# Patient Record
Sex: Female | Born: 1985 | Race: White | Hispanic: No | Marital: Single | Smoking: Current every day smoker
Health system: Southern US, Community
[De-identification: ages and names within clinical notes are randomized; demographics above are authoritative.]

## PROBLEM LIST (undated history)

## (undated) DIAGNOSIS — B977 Papillomavirus as the cause of diseases classified elsewhere: Secondary | ICD-10-CM

## (undated) DIAGNOSIS — IMO0002 Reserved for concepts with insufficient information to code with codable children: Secondary | ICD-10-CM

## (undated) HISTORY — PX: DILATION AND CURETTAGE OF UTERUS: SHX78

## (undated) HISTORY — PX: INTRAUTERINE DEVICE INSERTION: SHX323

## (undated) HISTORY — PX: TYMPANOSTOMY TUBE PLACEMENT: SHX32

---
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
## 2004-04-12 ENCOUNTER — Other Ambulatory Visit: Admission: RE | Admit: 2004-04-12 | Discharge: 2004-04-12 | Payer: Self-pay | Admitting: Family Medicine

## 2004-07-09 ENCOUNTER — Other Ambulatory Visit: Admission: RE | Admit: 2004-07-09 | Discharge: 2004-07-09 | Payer: Self-pay | Admitting: Obstetrics and Gynecology

## 2004-12-28 ENCOUNTER — Emergency Department (HOSPITAL_COMMUNITY): Admission: EM | Admit: 2004-12-28 | Discharge: 2004-12-28 | Payer: Self-pay | Admitting: Emergency Medicine

## 2005-02-15 ENCOUNTER — Emergency Department (HOSPITAL_COMMUNITY): Admission: EM | Admit: 2005-02-15 | Discharge: 2005-02-15 | Payer: Self-pay | Admitting: Family Medicine

## 2005-12-23 ENCOUNTER — Inpatient Hospital Stay (HOSPITAL_COMMUNITY): Admission: AD | Admit: 2005-12-23 | Discharge: 2005-12-23 | Payer: Self-pay | Admitting: Family Medicine

## 2006-01-13 ENCOUNTER — Ambulatory Visit (HOSPITAL_COMMUNITY): Admission: RE | Admit: 2006-01-13 | Discharge: 2006-01-13 | Payer: Self-pay | Admitting: Obstetrics and Gynecology

## 2006-05-06 ENCOUNTER — Inpatient Hospital Stay (HOSPITAL_COMMUNITY): Admission: AD | Admit: 2006-05-06 | Discharge: 2006-05-06 | Payer: Self-pay | Admitting: Obstetrics

## 2006-06-14 ENCOUNTER — Inpatient Hospital Stay (HOSPITAL_COMMUNITY): Admission: AD | Admit: 2006-06-14 | Discharge: 2006-06-17 | Payer: Self-pay | Admitting: Obstetrics & Gynecology

## 2006-06-24 ENCOUNTER — Inpatient Hospital Stay (HOSPITAL_COMMUNITY): Admission: RE | Admit: 2006-06-24 | Discharge: 2006-06-29 | Payer: Self-pay | Admitting: Obstetrics & Gynecology

## 2006-06-30 ENCOUNTER — Encounter: Admission: RE | Admit: 2006-06-30 | Discharge: 2006-07-30 | Payer: Self-pay | Admitting: Obstetrics & Gynecology

## 2006-07-31 ENCOUNTER — Encounter: Admission: RE | Admit: 2006-07-31 | Discharge: 2006-08-21 | Payer: Self-pay | Admitting: Obstetrics & Gynecology

## 2006-08-11 ENCOUNTER — Emergency Department (HOSPITAL_COMMUNITY): Admission: EM | Admit: 2006-08-11 | Discharge: 2006-08-11 | Payer: Self-pay | Admitting: Emergency Medicine

## 2008-05-18 IMAGING — US US OB COMP +14 WK
1 series · 13 of 28 positions shown · non-contrast
Comparison: none

CLINICAL DATA: 18 week 0 day gestational age by LMP.

[Series 1: us ob comp +14 wk · 0.25mm/px · 13 of 64 slices shown]
[im 3/64]
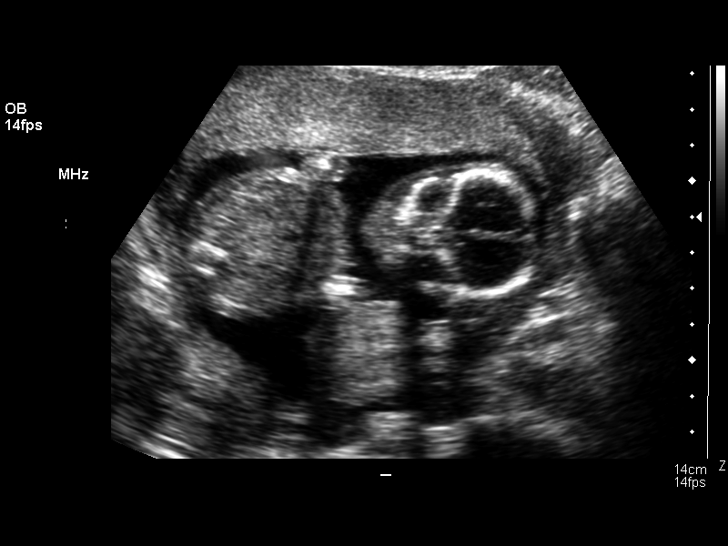
[im 8/64]
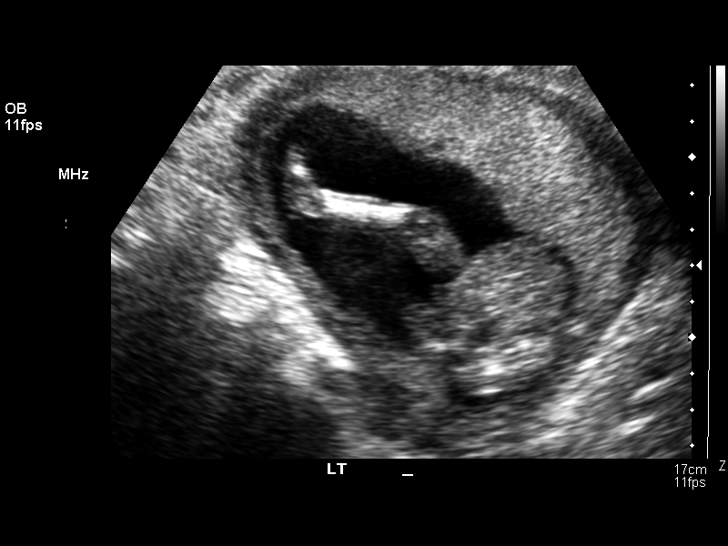
[im 12/64]
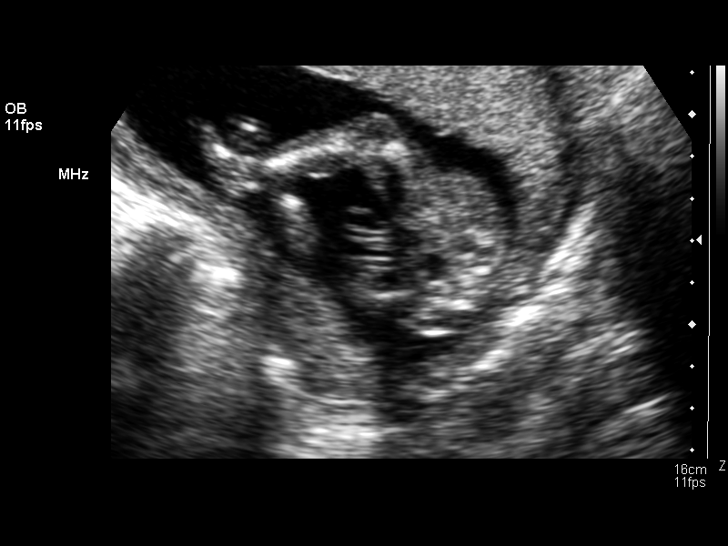
[im 17/64]
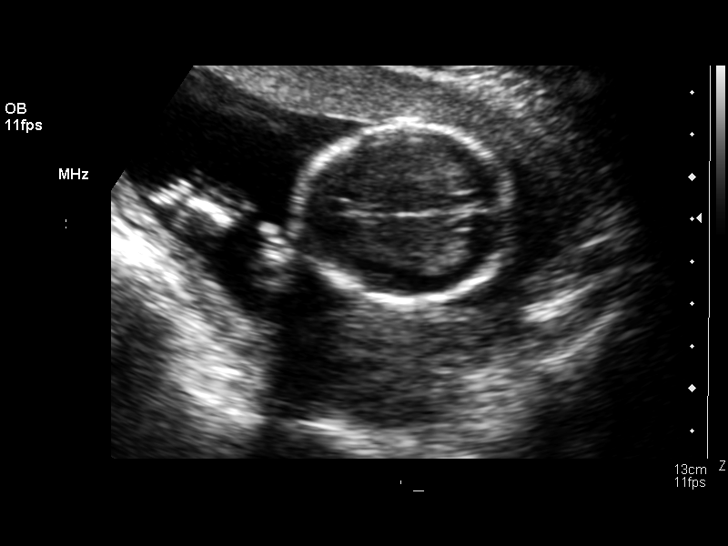
[im 22/64]
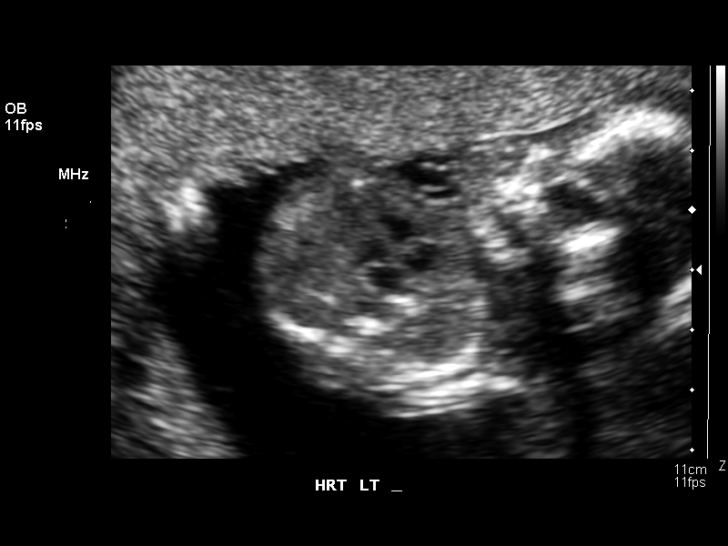
[im 26/64]
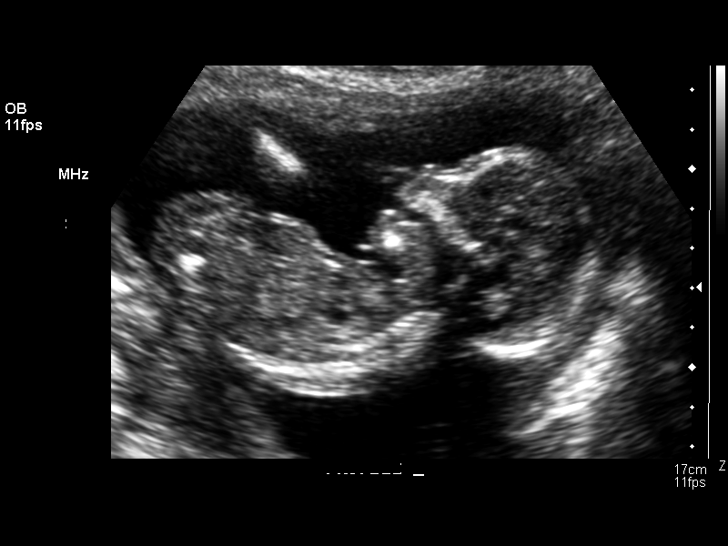
[im 33/64]
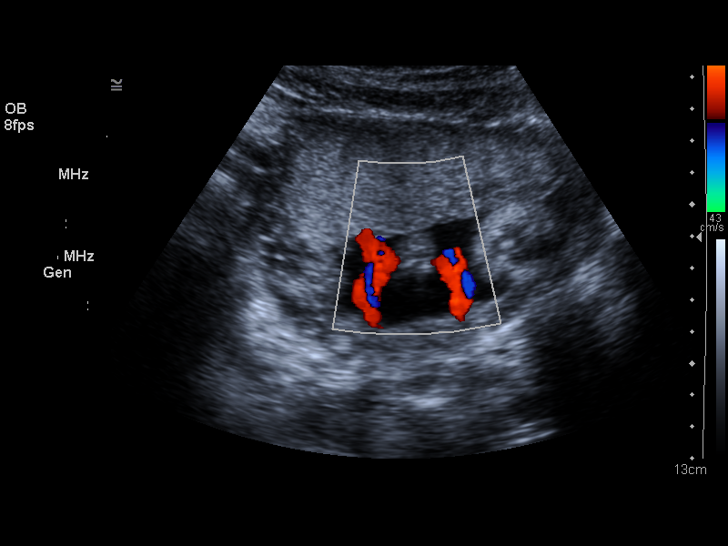
[im 38/64]
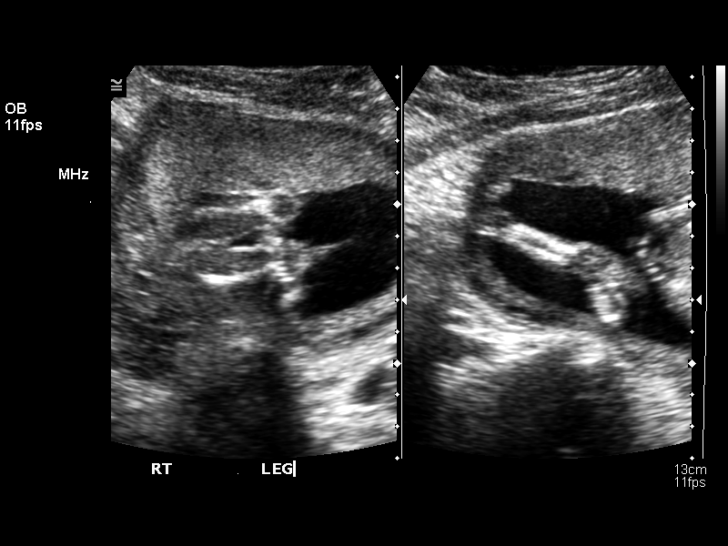
[im 43/64]
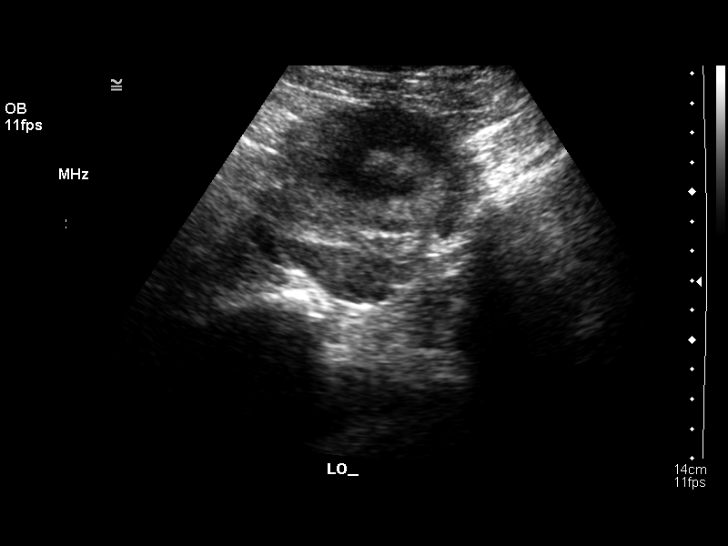
[im 47/64]
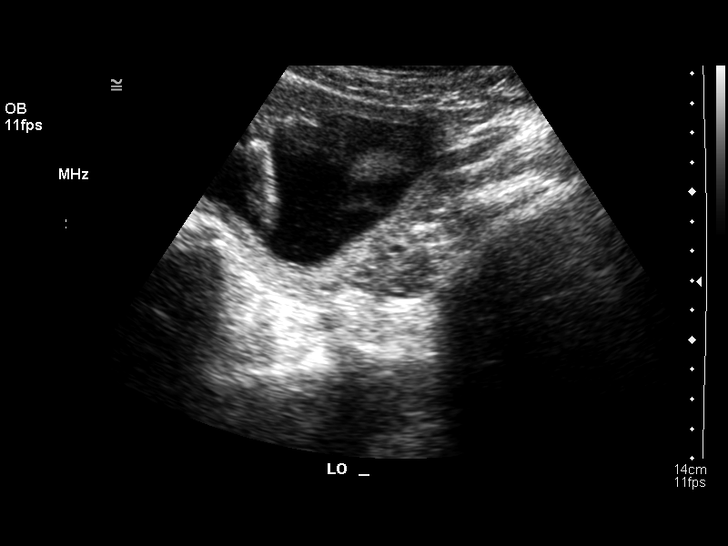
[im 52/64]
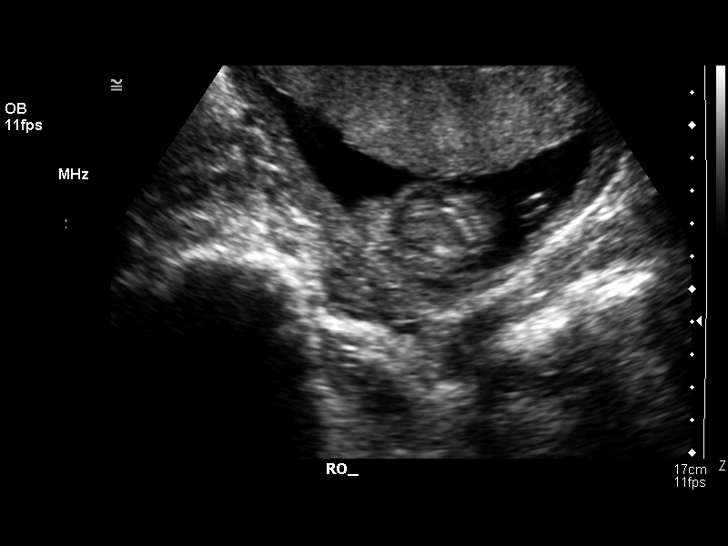
[im 57/64]
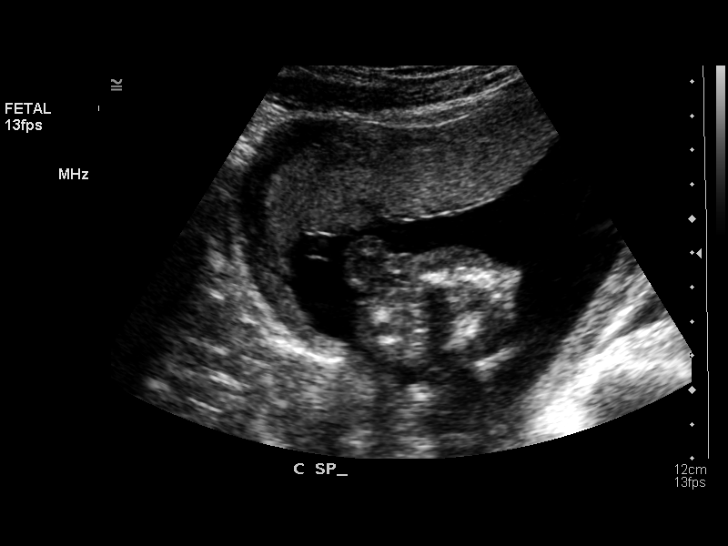
[im 61/64]
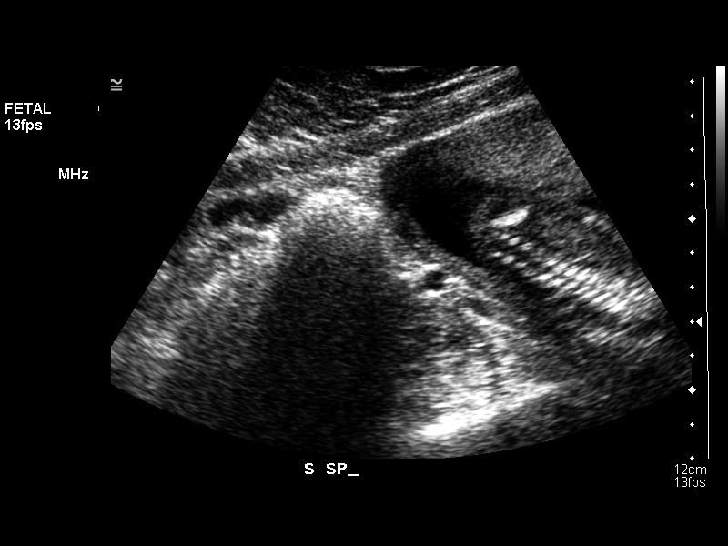

[13 of 28 positions shown; findings below may reference images not displayed]

OBSTETRICAL ULTRASOUND:
 Number of Fetuses:  1
 Heart Rate:  150 bpm
 Movement:  Yes
 Breathing:  No
 Presentation:  Transverse with head to maternal left
 Placental Location:  Anterior
 Grade:  I
 Previa:   No
 Amniotic Fluid (Subjective):  Normal
 Amniotic Fluid (Objective): 4.3 cm vertical pocket 

 FETAL BIOMETRY
 BPD:  4.0 cm   18 w 2 d 
 HC:  15.2 cm   18 w 2 d 
 AC:  13.1 cm   18 w 4 d 
 FL:  2.7 cm   18 w 1 d 

 MEAN GA:  18 w 2 d  US EDC:  06/14/06

 FETAL ANATOMY
 Lateral Ventricles:  Visualized 
 Thalami/CSP:  Visualized 
 Posterior Fossa:  Visualized 
 Nuchal Region:  NF= 2.9 mm  Visualized 
 Spine:  Visualized 
 4 Chamber Heart on Left:  Visualized 
 Stomach on Left:  Visualized 
 3 Vessel Cord:  Visualized 
 Cord Insertion site:  Visualized 
 Kidneys:  Visualized 
 Bladder:  Visualized 
 Extremities:  Visualized 

 ADDITIONAL ANATOMY VISUALIZED:  LVOT, orbits, profile, diaphragm, and heel.  

 Evaluation limited by:  Fetal position.  

 MATERNAL UTERINE AND ADNEXAL FINDINGS
 Cervix:  3.4 cm transabdominal.

 Both ovaries are unremarkable.
IMPRESSION: 1.  Single living intrauterine fetus with mean gestational age of 18 weeks 2 days and US EDC of 06/14/06.  This is concordant with LMP.  
 2.  No evidence of fetal anatomic abnormality.

## 2009-04-08 ENCOUNTER — Emergency Department (HOSPITAL_COMMUNITY): Admission: EM | Admit: 2009-04-08 | Discharge: 2009-04-08 | Payer: Self-pay | Admitting: Family Medicine

## 2010-07-09 NOTE — Discharge Summary (Signed)
NAMEMASIAH, LEWING                  ACCOUNT NO.:  [id number]   MEDICAL RECORD NO.:  [id number]          PATIENT TYPE:  INP   LOCATION:  9169                          FACILITY:  [facility]   PHYSICIAN:  Roseanna Rainbow, M.D.DATE OF BIRTH:  1985-03-14   DATE OF ADMISSION:  06/14/2006  DATE OF DISCHARGE:  06/17/2006                               DISCHARGE SUMMARY   HISTORY OF PRESENT ILLNESS:  The patient is a 25 year old, G1, P0 with  an estimated date of confinement of April 22, with an intrauterine  pregnancy at 39+ weeks for induction of labor secondary to possible  PUPPP.  Please see the dictated history and physical for further  details.   HOSPITAL COURSE:  The patient was admitted.  She was started on Pitocin  and there was no change in her cervix.  The Pitocin was discontinued and  attempts were made to ripen the cervix with Cervidil and subsequent to  the Cervidil being removed, the cervix remained unfavorable and decision  was made to abort any further attempts to induce labor.  She was  discharged to home with plans to repeat an attempted induction at a  later date.   DISCHARGE DIAGNOSIS:  Intrauterine pregnancy at term, failed induction,  possible pruritic urticarial papules and plaque of pregnancy (PUPPP).   CONDITION ON DISCHARGE:  Stable.   DIET:  Regular.   ACTIVITY:  Ad lib.   DISCHARGE MEDICATIONS:  Resume previous medications.   DISPOSITION:  The patient was to follow up in the office in several  days.      Roseanna Rainbow, M.D.  Electronically Signed     LAJ/MEDQ  D:  07/31/2006  T:  07/31/2006  Job:  [id number]

## 2010-07-12 NOTE — H&P (Signed)
Katrina Oconnell, Katrina Oconnell               ACCOUNT NO.:  [id number]   MEDICAL RECORD NO.:  [id number]          PATIENT TYPE:  INP   LOCATION:  9169                          FACILITY:  [facility]   PHYSICIAN:  Roseanna Rainbow, M.D.DATE OF BIRTH:  December 18, 1985   DATE OF ADMISSION:  06/14/2006  DATE OF DISCHARGE:                              HISTORY & PHYSICAL   CHIEF COMPLAINT:  The patient is a 26 year old gravida 1, para 0 with an  estimated date of confinement of June 16, 2006 with an intrauterine  pregnancy at 39+ weeks for induction of labor secondary to possible  PUPPP.   HISTORY OF PRESENT ILLNESS:  Please see the above.  The patient gives a  several-week history of diffuse pruritus.  Bile acids and liver  transaminase testing has been normal.  She has recently completed a Solu-  Medrol dose pack.  She has also been using other supportive measures.   ALLERGIES:  NO KNOWN DRUG ALLERGIES.   MEDICATIONS:  Please see the reconciliation form.   OB RISK FACTORS:  Bipolar disorder.   LABORATORY DATA:  One-hour GCT 100.  Chlamydia in April negative.  Hepatitis B surface antigen negative.  Hematocrit 35.1, hemoglobin 11.8.  Pap smear LSIL.  Platelets 192,000.  Blood type is O positive, antibody  screen negative.  RPR nonreactive.  Rubella nonimmune.  Varicella  immune.   PAST GYN HISTORY:  Please see the above.   PAST SURGICAL HISTORY:  1. Ear surgery.  2. Eye surgery.   SOCIAL HISTORY:  She works at [employer].  She is  separated from her spouse.  Does not drink alcoholic beverages.  She is  a recovering alcoholic.  She has no significant smoking history.  Previous used marijuana and cocaine.  No current substance abuse.   FAMILY HISTORY:  No major illnesses known.   PHYSICAL EXAMINATION:  VITAL SIGNS:  Blood pressure 124/74, weight 216  pounds.  Fetal heart tracing reassuring.  Tocodynamometer with regular  uterine contractions.  Sterile vaginal exam per the  R.N.   ASSESSMENT:  Primigravida at term.  Likely pruritic urticarial papules  and plaques of pregnancy.  Unfavorable Bishop's score.   PLAN:  Admission.  Two-stage induction of labor.  Will start with  cervical ripening with Cervidil.      Roseanna Rainbow, M.D.  Electronically Signed     LAJ/MEDQ  D:  06/15/2006  T:  06/15/2006  Job:  [id number]

## 2010-12-02 ENCOUNTER — Inpatient Hospital Stay (INDEPENDENT_AMBULATORY_CARE_PROVIDER_SITE_OTHER)
Admission: RE | Admit: 2010-12-02 | Discharge: 2010-12-02 | Disposition: A | Discharge status: Home / Self Care | Payer: Self-pay | Source: Ambulatory Visit | Attending: Family Medicine | Admitting: Family Medicine

## 2010-12-02 DIAGNOSIS — H538 Other visual disturbances: Secondary | ICD-10-CM

## 2010-12-02 LAB — GLUCOSE, CAPILLARY

## 2010-12-11 LAB — POCT PREGNANCY, URINE
Operator id: 116391
Preg Test, Ur: NEGATIVE

## 2012-01-25 ENCOUNTER — Inpatient Hospital Stay (HOSPITAL_COMMUNITY): Payer: Commercial Managed Care - PPO

## 2012-01-25 ENCOUNTER — Encounter (HOSPITAL_COMMUNITY): Payer: Self-pay | Admitting: Obstetrics and Gynecology

## 2012-01-25 ENCOUNTER — Inpatient Hospital Stay (HOSPITAL_COMMUNITY)
Admission: AD | Admit: 2012-01-25 | Discharge: 2012-01-25 | Disposition: A | Discharge status: Home / Self Care | Payer: Commercial Managed Care - PPO | Source: Ambulatory Visit | Attending: Obstetrics & Gynecology | Admitting: Obstetrics & Gynecology

## 2012-01-25 DIAGNOSIS — N949 Unspecified condition associated with female genital organs and menstrual cycle: Secondary | ICD-10-CM | POA: Insufficient documentation

## 2012-01-25 DIAGNOSIS — R109 Unspecified abdominal pain: Secondary | ICD-10-CM | POA: Insufficient documentation

## 2012-01-25 DIAGNOSIS — N946 Dysmenorrhea, unspecified: Secondary | ICD-10-CM | POA: Insufficient documentation

## 2012-01-25 HISTORY — DX: Reserved for concepts with insufficient information to code with codable children: IMO0002

## 2012-01-25 HISTORY — DX: Papillomavirus as the cause of diseases classified elsewhere: B97.7

## 2012-01-25 LAB — URINALYSIS, ROUTINE W REFLEX MICROSCOPIC
Glucose, UA: NEGATIVE mg/dL
Ketones, ur: 15 mg/dL — AB
Leukocytes, UA: NEGATIVE
Nitrite: NEGATIVE
Protein, ur: NEGATIVE mg/dL

## 2012-01-25 LAB — URINE MICROSCOPIC-ADD ON

## 2012-01-25 LAB — WET PREP, GENITAL: Clue Cells Wet Prep HPF POC: NONE SEEN

## 2012-01-25 LAB — POCT PREGNANCY, URINE: Preg Test, Ur: NEGATIVE

## 2012-01-25 MED ORDER — IBUPROFEN 800 MG PO TABS: 800.0000 mg | ORAL_TABLET | Freq: Three times a day (TID) | ORAL | Status: AC | PRN

## 2012-01-25 MED ORDER — KETOROLAC TROMETHAMINE 60 MG/2ML IM SOLN
60.0000 mg | Freq: Once | INTRAMUSCULAR | Status: AC
  Administered 2012-01-25: 60 mg via INTRAMUSCULAR
  Filled 2012-01-25: qty 2

## 2012-01-25 NOTE — MAU Note (Signed)
Pt reports having a LLQ pain that started this morning. Pt has been having this pain  When her period starts. Told she has an ovarian cyst when she was younger.

## 2012-01-25 NOTE — MAU Provider Note (Signed)
History     CSN: [id number]  Arrival date and time: 01/25/12 1223   First Provider Initiated Contact with Patient 01/25/12 1339      Chief Complaint  Patient presents with  . Abdominal Pain   HPI 26 y.o. G1P1001 with left sided pelvic pain. States she has had this pain intermittently x 10 years or more around the time of her period. Started this morning again, worse today than usual. Periods are heavy and more painful since getting paragard, which she has had for 5 years.    Past Medical History  Diagnosis Date  . Abnormal Pap smear   . HPV (human papilloma virus) infection     Past Surgical History  Procedure Date  . Tympanostomy tube placement   . Dilation and curettage of uterus   . Intrauterine device insertion     Family History  Problem Relation Age of Onset  . Adopted: Yes  . Family history unknown: Yes    History  Substance Use Topics  . Smoking status: Current Every Day Smoker  . Smokeless tobacco: Not on file  . Alcohol Use: Yes    Allergies: No Known Allergies  No prescriptions prior to admission    Review of Systems  Constitutional: Negative.   Respiratory: Negative.   Cardiovascular: Negative.   Gastrointestinal: Positive for abdominal pain. Negative for nausea, vomiting, diarrhea and constipation.  Genitourinary: Negative for dysuria, urgency, frequency, hematuria and flank pain.       Negative for vaginal bleeding (end of menses), vaginal discharge  Musculoskeletal: Negative.   Neurological: Negative.   Psychiatric/Behavioral: Negative.    Physical Exam   Blood pressure 125/77, pulse 76, temperature 97.9 F (36.6 C), temperature source Oral, resp. rate 18, height 5\' 5"  (1.651 m), weight 218 lb (98.884 kg), last menstrual period 01/19/2012.  Physical Exam  Nursing note and vitals reviewed. Constitutional: She is oriented to person, place, and time. She appears well-developed and well-nourished. No distress.  HENT:  Head: Normocephalic  and atraumatic.  Cardiovascular: Normal rate.   Respiratory: Effort normal.  GI: Soft. Bowel sounds are normal. She exhibits no mass. There is no tenderness. There is no rebound and no guarding.  Genitourinary: There is no rash or lesion on the right labia. There is no rash or lesion on the left labia. Uterus is not tender. Enlarged: Size c/w dates. Cervix exhibits no motion tenderness, no discharge and no friability. Right adnexum displays no mass, no tenderness and no fullness. Left adnexum displays tenderness and fullness. Left adnexum displays no mass. There is bleeding (small) around the vagina. No tenderness around the vagina. No vaginal discharge found.  Musculoskeletal: Normal range of motion.  Neurological: She is alert and oriented to person, place, and time.  Skin: Skin is warm and dry.  Psychiatric: She has a normal mood and affect.    MAU Course  Procedures Results for orders placed during the hospital encounter of 01/25/12 (from the past 24 hour(s))  URINALYSIS, ROUTINE W REFLEX MICROSCOPIC     Status: Abnormal   Collection Time   01/25/12 12:50 PM      Component Value Range   Color, Urine YELLOW  YELLOW   APPearance CLOUDY (*) CLEAR   Specific Gravity, Urine >1.030 (*) 1.005 - 1.030   pH 5.5  5.0 - 8.0   Glucose, UA NEGATIVE  NEGATIVE mg/dL   Hgb urine dipstick TRACE (*) NEGATIVE   Bilirubin Urine NEGATIVE  NEGATIVE   Ketones, ur 15 (*) NEGATIVE mg/dL  Protein, ur NEGATIVE  NEGATIVE mg/dL   Urobilinogen, UA 0.2  0.0 - 1.0 mg/dL   Nitrite NEGATIVE  NEGATIVE   Leukocytes, UA NEGATIVE  NEGATIVE  URINE MICROSCOPIC-ADD ON     Status: Abnormal   Collection Time   01/25/12 12:50 PM      Component Value Range   Squamous Epithelial / LPF FEW (*) RARE   WBC, UA 0-2  <3 WBC/hpf   RBC / HPF 0-2  <3 RBC/hpf  POCT PREGNANCY, URINE     Status: Normal   Collection Time   01/25/12  1:02 PM      Component Value Range   Preg Test, Ur NEGATIVE  NEGATIVE  WET PREP, GENITAL      Status: Abnormal   Collection Time   01/25/12  1:40 PM      Component Value Range   Yeast Wet Prep HPF POC NONE SEEN  NONE SEEN   Trich, Wet Prep NONE SEEN  NONE SEEN   Clue Cells Wet Prep HPF POC NONE SEEN  NONE SEEN   WBC, Wet Prep HPF POC MANY (*) NONE SEEN   US Transvaginal Non-ob  01/25/2012  *RADIOLOGY REPORT*  Clinical Data: 26 year old G1 P1, LMP 01/19/2012, indwelling IUD, presenting with left lower quadrant abdominal pain and left pelvic pain.  TRANSABDOMINAL AND TRANSVAGINAL ULTRASOUND OF PELVIS  Technique:  Both transabdominal and transvaginal ultrasound examinations of the pelvis were performed. Transabdominal technique was performed for global imaging of the pelvis including uterus, ovaries, adnexal regions, and pelvic cul-de-sac.  It was necessary to proceed with endovaginal exam following the transabdominal exam to visualize the endometrium and ovaries, as the bladder was suboptimally distended.  Comparison:  No prior pelvic ultrasound apart from an early OB ultrasound 01/13/2006.  Findings:  Uterus: Normal in size and echotexture without focal myometrial abnormality, measuring approximately 7.9 x 3.8 x 4.9 cm.  Endometrium: Normal in thickness measuring approximately 5 mm. Intrauterine device appropriately positioned within the fundal endometrium.  Very small amount of simple appearing free fluid in the endometrial canal.  Right ovary:  Normal in size measuring approximately 3.0 x 2.2 x 2.5 cm, containing small follicular cysts.  No dominant cyst or solid masses.  Left ovary: Normal in size measuring approximately 3.6 x 1.7 x 1.7 cm, containing small follicular cysts.  No dominant cyst or solid masses.  Other findings: No free fluid in the cul-de-sac.  IMPRESSION:  1.  No abnormalities identified to explain left-sided pelvic pain. 2.  Intrauterine device appropriately positioned within the fundal endometrium.  Very small amount of simple appearing free fluid in the endometrium is of  doubtful clinical significance. 3.  Normal-appearing ovaries.  No adnexal masses or free pelvic fluid.   Original Report Authenticated By: Hulan Saas, M.D.    US Pelvis Complete  01/25/2012  *RADIOLOGY REPORT*  Clinical Data: 26 year old G1 P1, LMP 01/19/2012, indwelling IUD, presenting with left lower quadrant abdominal pain and left pelvic pain.  TRANSABDOMINAL AND TRANSVAGINAL ULTRASOUND OF PELVIS  Technique:  Both transabdominal and transvaginal ultrasound examinations of the pelvis were performed. Transabdominal technique was performed for global imaging of the pelvis including uterus, ovaries, adnexal regions, and pelvic cul-de-sac.  It was necessary to proceed with endovaginal exam following the transabdominal exam to visualize the endometrium and ovaries, as the bladder was suboptimally distended.  Comparison:  No prior pelvic ultrasound apart from an early OB ultrasound 01/13/2006.  Findings:  Uterus: Normal in size and echotexture without focal myometrial abnormality, measuring approximately  7.9 x 3.8 x 4.9 cm.  Endometrium: Normal in thickness measuring approximately 5 mm. Intrauterine device appropriately positioned within the fundal endometrium.  Very small amount of simple appearing free fluid in the endometrial canal.  Right ovary:  Normal in size measuring approximately 3.0 x 2.2 x 2.5 cm, containing small follicular cysts.  No dominant cyst or solid masses.  Left ovary: Normal in size measuring approximately 3.6 x 1.7 x 1.7 cm, containing small follicular cysts.  No dominant cyst or solid masses.  Other findings: No free fluid in the cul-de-sac.  IMPRESSION:  1.  No abnormalities identified to explain left-sided pelvic pain. 2.  Intrauterine device appropriately positioned within the fundal endometrium.  Very small amount of simple appearing free fluid in the endometrium is of doubtful clinical significance. 3.  Normal-appearing ovaries.  No adnexal masses or free pelvic fluid.   Original  Report Authenticated By: Hulan Saas, M.D.     Assessment and Plan   1. Dysmenorrhea       Medication List     As of 01/25/2012  3:31 PM    START taking these medications         ibuprofen 800 MG tablet   Commonly known as: ADVIL,MOTRIN   Take 1 tablet (800 mg total) by mouth every 8 (eight) hours as needed for pain.          Where to get your medications    These are the prescriptions that you need to pick up. We sent them to a specific pharmacy, so you will need to go there to get them.   [id number] [place], [place] - [address]    [address] [place] [address]    Phone: [phone]        ibuprofen 800 MG tablet            Follow-up Information    Follow up with [facility]. (As needed)    Contact information:   [address] [place] [address] [phone]           Jerardo Costabile 01/25/2012, 3:31 PM

## 2012-01-26 NOTE — MAU Provider Note (Signed)
Attestation of Attending Supervision of Advanced Practitioner (CNM/NP): Evaluation and management procedures were performed by the Advanced Practitioner under my supervision and collaboration. I have reviewed the Advanced Practitioner's note and chart, and I agree with the management and plan.  Amdrew Oboyle H. 9:25 PM   

## 2012-01-27 LAB — GC/CHLAMYDIA PROBE AMP
CT Probe RNA: NEGATIVE
GC Probe RNA: NEGATIVE

## 2012-03-12 ENCOUNTER — Ambulatory Visit (HOSPITAL_COMMUNITY)
Admission: RE | Admit: 2012-03-12 | Discharge: 2012-03-12 | Disposition: A | Discharge status: Home / Self Care | Payer: Commercial Managed Care - PPO | Attending: Psychiatry | Admitting: Psychiatry

## 2012-03-12 ENCOUNTER — Encounter (HOSPITAL_COMMUNITY): Payer: Self-pay | Admitting: Licensed Clinical Social Worker

## 2012-03-12 DIAGNOSIS — F1010 Alcohol abuse, uncomplicated: Secondary | ICD-10-CM | POA: Insufficient documentation

## 2012-03-12 DIAGNOSIS — F329 Major depressive disorder, single episode, unspecified: Secondary | ICD-10-CM

## 2012-03-12 DIAGNOSIS — F39 Unspecified mood [affective] disorder: Secondary | ICD-10-CM | POA: Insufficient documentation

## 2012-03-12 DIAGNOSIS — F1210 Cannabis abuse, uncomplicated: Secondary | ICD-10-CM | POA: Insufficient documentation
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
## 2012-03-12 NOTE — Progress Notes (Signed)
[facility] MD Progress Note  03/12/2012 4:33 PM Katrina Oconnell  MRN:  [id number] Subjective:  Depressed, argument with her mother today   Sleep: Fair  Appetite:  Fair  Suicidal Ideation:  Denies Homicidal Ideation:  Denies  Psychiatric Specialty Exam: ROS  There were no vitals taken for this visit.There is no height or weight on file to calculate BMI.  General Appearance: Casual  Eye Contact::  Fair  Speech:  Normal Rate  Volume:  Normal  Mood:  Depressed  Affect:  Congruent  Thought Process:  Coherent  Orientation:  Full (Time, Place, and Person)  Thought Content:  WDL  Suicidal Thoughts:  No  Homicidal Thoughts:  No  Memory:  Immediate;   Fair Recent;   Fair Remote;   Fair  Judgement:  Fair  Insight:  Fair  Psychomotor Activity:  Normal  Concentration:  Fair  Recall:  Fair  Akathisia:  No  Handed:  Right  AIMS (if indicated):     Assets:  Housing Physical Health Resilience Social Support  Sleep:      Current Medications: Current Outpatient Prescriptions  Medication Sig Dispense Refill  . ibuprofen (ADVIL,MOTRIN) 800 MG tablet Take 1 tablet (800 mg total) by mouth every 8 (eight) hours as needed for pain.  30 tablet  0    Plan:  Appointment on Apr 04, 2012 with Liborio Nixon at [facility].   I certify that inpatient services furnished can reasonably be expected to improve the patient's condition.   Nanine Means 03/12/2012, 4:33 PM

## 2012-03-15 NOTE — H&P (Signed)
Behavioral Health Medical Screening Exam  Katrina Oconnell is an 26 y.o. female.  Review of Systems  Constitutional: Negative.   HENT: Negative.   Eyes: Negative.   Respiratory: Negative.   Cardiovascular: Negative.   Gastrointestinal: Negative.   Genitourinary: Negative.   Musculoskeletal: Negative.   Skin: Negative.   Neurological: Negative.   Endo/Heme/Allergies: Negative.   Psychiatric/Behavioral: Positive for depression.    Physical Exam  Constitutional: She is oriented to person, place, and time. She appears well-developed and well-nourished.  HENT:  Head: Normocephalic.  Eyes: Pupils are equal, round, and reactive to light.  Neck: Normal range of motion. Neck supple.  Cardiovascular: Normal rate.   Respiratory: Effort normal.  GI: Soft.  Genitourinary:       deferred  Musculoskeletal: Normal range of motion.  Neurological: She is alert and oriented to person, place, and time.  Skin: Skin is warm and dry.    There were no vitals taken for this visit.  Recommendations:  Based on my evaluation the patient does not appear to have an emergency medical condition.   Patient and her mother got into an argument over her taking money.  Katrina Oconnell was upset and came to [facility] to restart her medications that she had been off for months.  An appointment was made for her and encouraged her to attend.  Katrina Oconnell, PMH-NP 03/15/2012, 12:06 PM

## 2012-03-17 NOTE — H&P (Signed)
Reviewed

## 2012-04-05 ENCOUNTER — Ambulatory Visit (HOSPITAL_COMMUNITY): Payer: Commercial Managed Care - PPO | Admitting: Physician Assistant

## 2012-10-18 ENCOUNTER — Encounter (HOSPITAL_COMMUNITY): Payer: Self-pay | Admitting: Emergency Medicine

## 2012-10-18 ENCOUNTER — Emergency Department (INDEPENDENT_AMBULATORY_CARE_PROVIDER_SITE_OTHER)
Admission: EM | Admit: 2012-10-18 | Discharge: 2012-10-18 | Disposition: A | Discharge status: Home / Self Care | Payer: Commercial Managed Care - PPO | Source: Home / Self Care | Attending: Family Medicine | Admitting: Family Medicine

## 2012-10-18 DIAGNOSIS — J069 Acute upper respiratory infection, unspecified: Secondary | ICD-10-CM

## 2012-10-18 LAB — MRSA PCR SCREENING: MRSA by PCR: NEGATIVE

## 2012-10-18 MED ORDER — MUPIROCIN CALCIUM 2 % NA OINT: TOPICAL_OINTMENT | NASAL | Status: AC

## 2012-10-18 NOTE — ED Provider Notes (Signed)
CSN: [id number]     Arrival date & time 10/18/12  1018 History     First MD Initiated Contact with Patient 10/18/12 1046     Chief Complaint  Patient presents with  . MRSA    (Consider location/radiation/quality/duration/timing/severity/associated sxs/prior Treatment) HPI Comments: 27 year old female smoker. Here concerned about possible MRSA colonization in her upper airways. Patient states that she has a son with cystic fibrosis but recently had a throat swab that grew MRSA. She is concerned as she passed it on to him. Patient reports that she had nasal congestion and cough about a week ago but her symptoms have resolved. Denies nasal  Drainage.denies sore throat. Denies fever or chills. Normal appetite and energy level. Nausea was of breath or chest pain. Patient wants to be "checked" for MRSA.   Past Medical History  Diagnosis Date  . Abnormal Pap smear   . HPV (human papilloma virus) infection    Past Surgical History  Procedure Laterality Date  . Tympanostomy tube placement    . Dilation and curettage of uterus    . Intrauterine device insertion     Family History  Problem Relation Age of Onset  . Adopted: Yes   History  Substance Use Topics  . Smoking status: Current Every Day Smoker  . Smokeless tobacco: Not on file  . Alcohol Use: 2.4 oz/week    3 Cans of beer, 1 Shots of liquor per week   OB History   Grav Para Term Preterm Abortions TAB SAB Ect Mult Living   1 1 1  0 0 0 0 0 0 1     Review of Systems  Constitutional: Negative for fever, chills, diaphoresis, activity change, appetite change and fatigue.  HENT: Negative for nosebleeds, rhinorrhea and postnasal drip.   Eyes: Negative for discharge.  Respiratory: Positive for cough. Negative for chest tightness, shortness of breath and wheezing.   Cardiovascular: Negative for chest pain.  Gastrointestinal: Negative for nausea, vomiting, abdominal pain and diarrhea.  Skin: Negative for rash.  Neurological:  Negative for dizziness and headaches.    Allergies  Review of patient's allergies indicates no known allergies.  Home Medications   Current Outpatient Rx  Name  Route  Sig  Dispense  Refill  . ibuprofen (ADVIL,MOTRIN) 800 MG tablet   Oral   Take 1 tablet (800 mg total) by mouth every 8 (eight) hours as needed for pain.   30 tablet   0   . mupirocin nasal ointment (BACTROBAN) 2 %      Apply in each nostril daily   1 g   0    BP 120/88  Pulse 57  Temp(Src) 97.5 F (36.4 C) (Oral)  Resp 20  SpO2 97%  LMP 09/13/2012 Physical Exam  Nursing note and vitals reviewed. Constitutional: She is oriented to person, place, and time. She appears well-developed and well-nourished. No distress.  HENT:  Head: Normocephalic and atraumatic.  Right Ear: External ear normal.  Left Ear: External ear normal.  Mouth/Throat: Oropharynx is clear and moist. No oropharyngeal exudate.  Eyes: Conjunctivae are normal. Right eye exhibits no discharge. Left eye exhibits no discharge. No scleral icterus.  Neck: Neck supple.  Cardiovascular: Normal heart sounds.   Pulmonary/Chest: Breath sounds normal. No respiratory distress. She has no wheezes. She has no rales. She exhibits no tenderness.  Lymphadenopathy:    She has no cervical adenopathy.  Neurological: She is alert and oriented to person, place, and time.  Skin: No rash noted. She is not  diaphoretic.    ED Course   Procedures (including critical care time)  Labs Reviewed - No data to display No results found. 1. URI (upper respiratory infection)     MDM  Performed a nasal swab. Prescribed mupirocin nasal ointment. Supportive care and red flags that should prompt her return to medical attention discussed with patient and provided in writing  Sharin Grave, MD 10/19/12 1038

## 2012-10-18 NOTE — ED Notes (Signed)
Chart review.

## 2012-10-18 NOTE — ED Notes (Signed)
Call from [employer], asking for authorization to substitute muciprin ointment for muciprin nasal oint; authorized

## 2012-10-18 NOTE — ED Notes (Signed)
C/o wants to check for MRSA since son has had a culture done and was positive for MRSA

## 2012-10-26 ENCOUNTER — Telehealth (HOSPITAL_COMMUNITY): Payer: Self-pay | Admitting: *Deleted

## 2012-10-26 NOTE — ED Notes (Signed)
Pt. notified that her MRSA screen by PCR was negative as directed by Dr. Tressia Danas. Vassie Moselle 10/26/2012

## 2012-10-27 NOTE — ED Notes (Signed)
Chart review.

## 2013-12-26 ENCOUNTER — Encounter (HOSPITAL_COMMUNITY): Payer: Self-pay | Admitting: Emergency Medicine

## 2014-05-30 IMAGING — US US TRANSVAGINAL NON-OB
1 series · 13 of 25 positions shown · non-contrast
Comparison: No prior pelvic ultrasound apart from an early OB
ultrasound 01/13/2006.

CLINICAL DATA: 26-year-old G1 P1, LMP 01/19/2012, indwelling IUD,
presenting with left lower quadrant abdominal pain and left pelvic
pain.

TRANSABDOMINAL AND TRANSVAGINAL ULTRASOUND OF PELVIS
TECHNIQUE: Both transabdominal and transvaginal ultrasound
examinations of the pelvis were performed. Transabdominal technique
was performed for global imaging of the pelvis including uterus,
ovaries, adnexal regions, and pelvic cul-de-sac.
It was necessary to proceed with endovaginal exam following the
transabdominal exam to visualize the endometrium and ovaries, as
the bladder was suboptimally distended..

[Series 1: us pelvis complete · 13 of 42 slices shown]
[im 1/42]
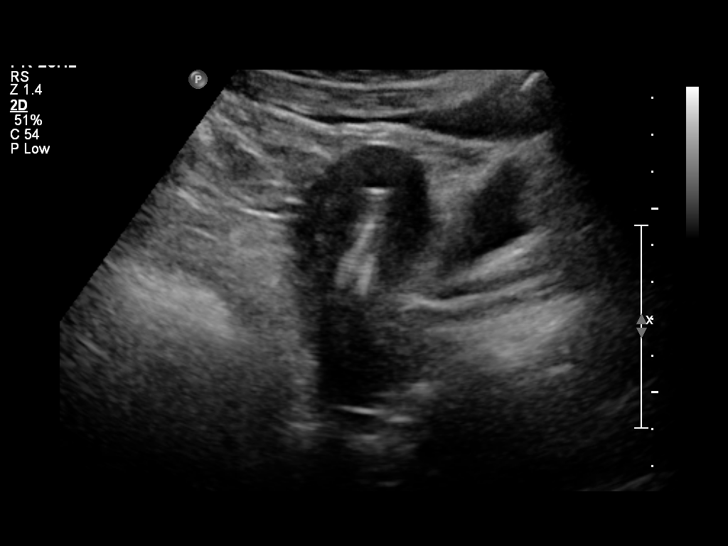
[im 4/42]
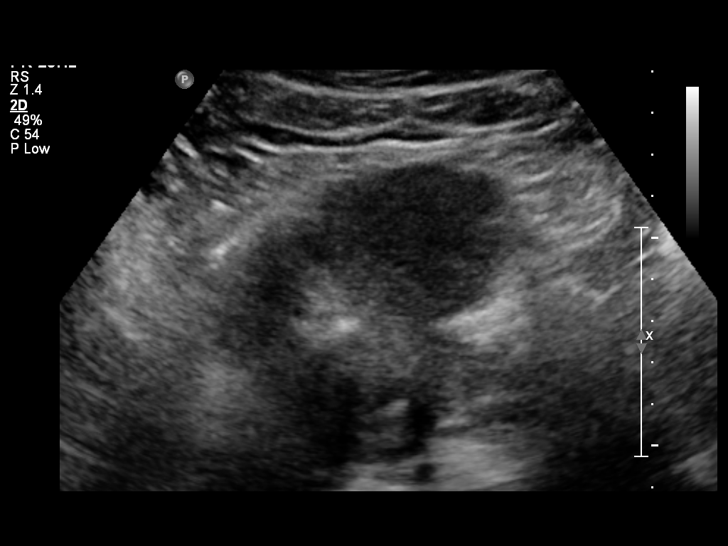
[im 7/42]
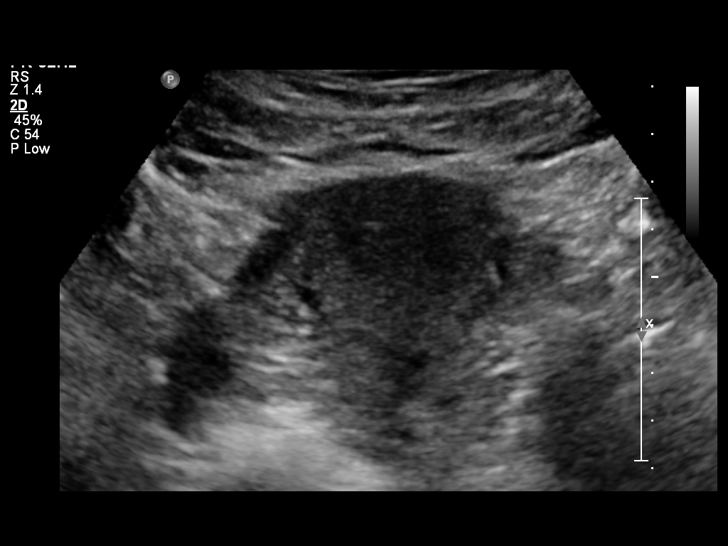
[im 11/42]
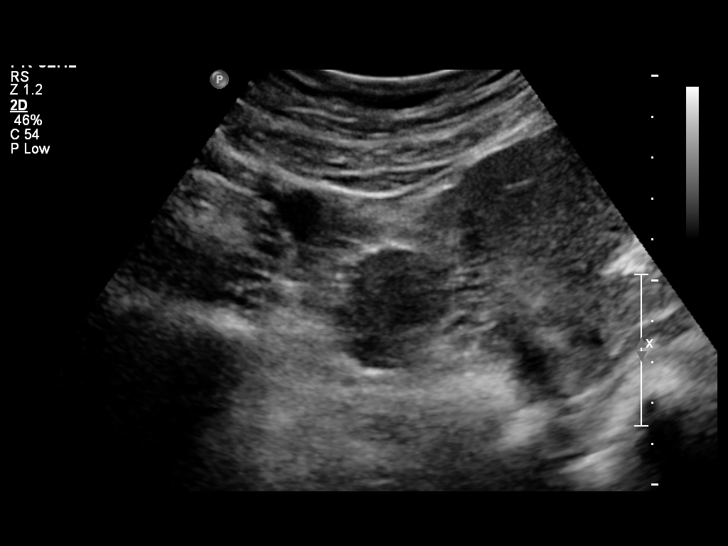
[im 14/42]
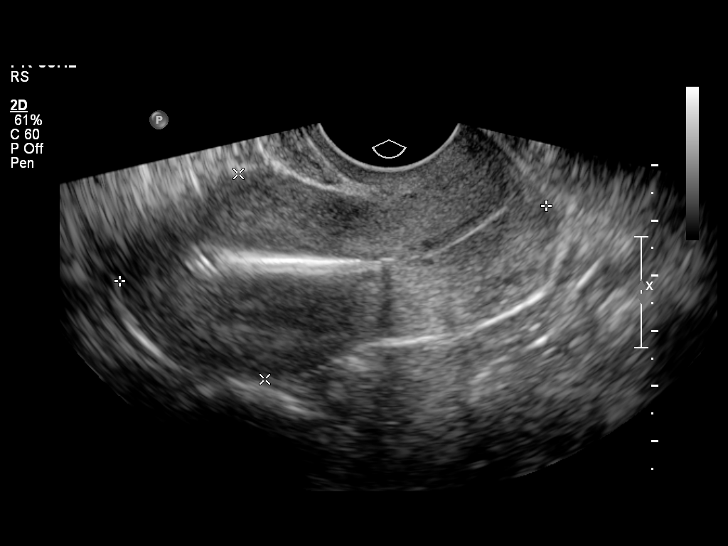
[im 18/42]
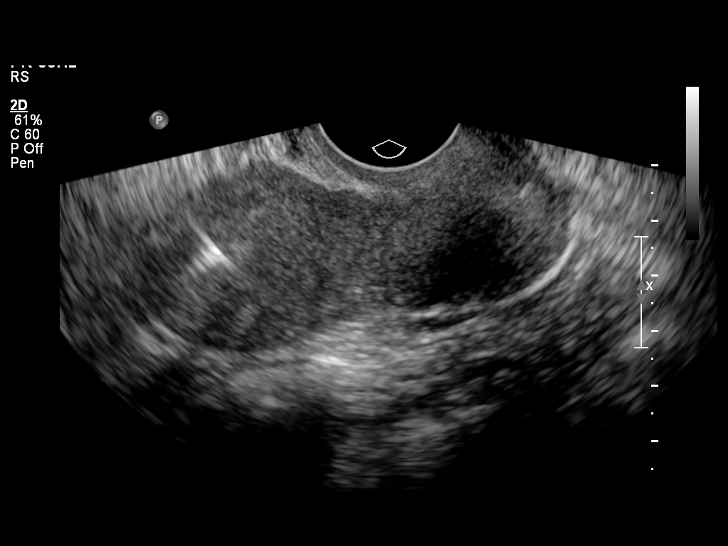
[im 21/42]
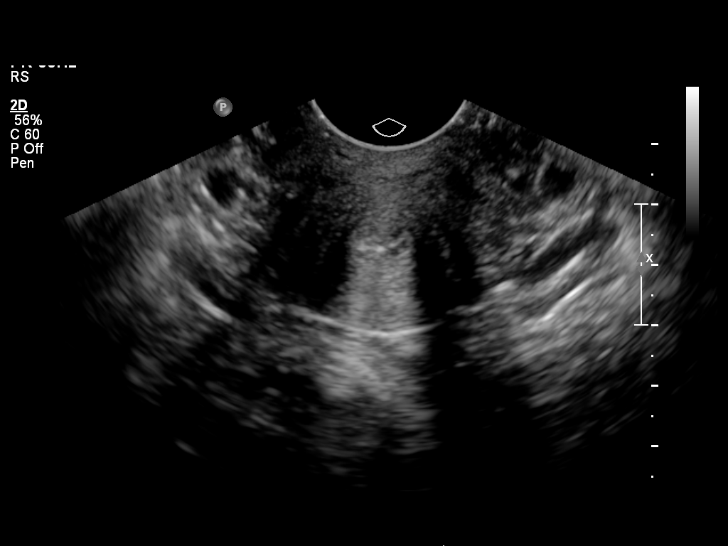
[im 24/42]
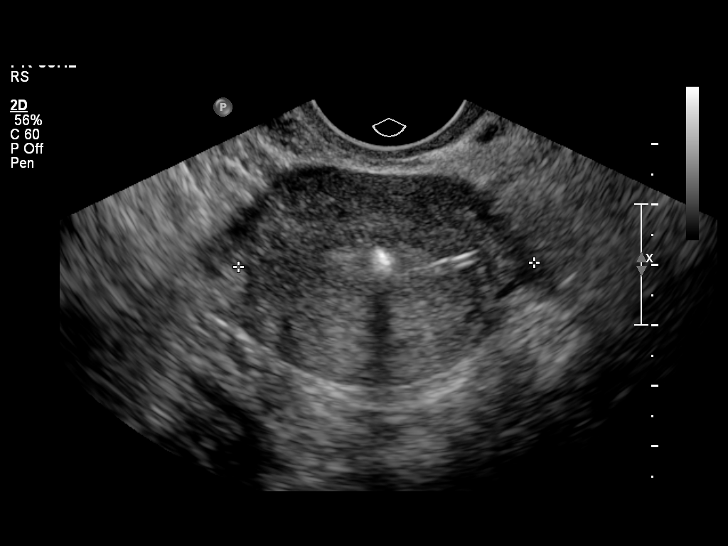
[im 28/42]
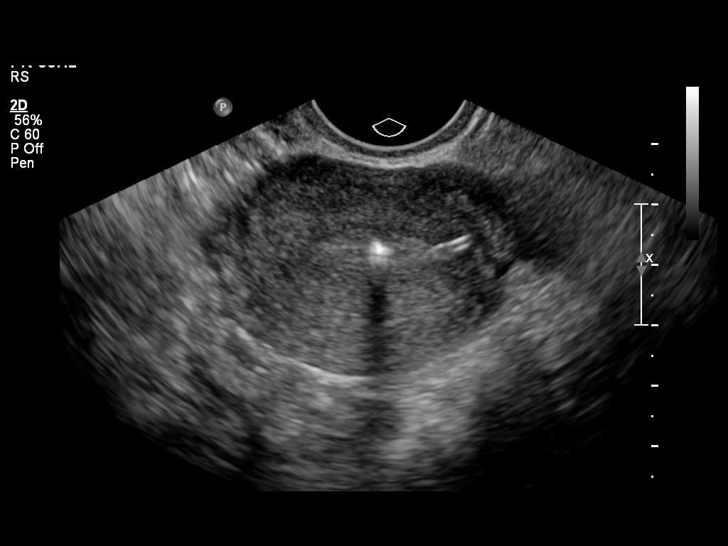
[im 31/42]
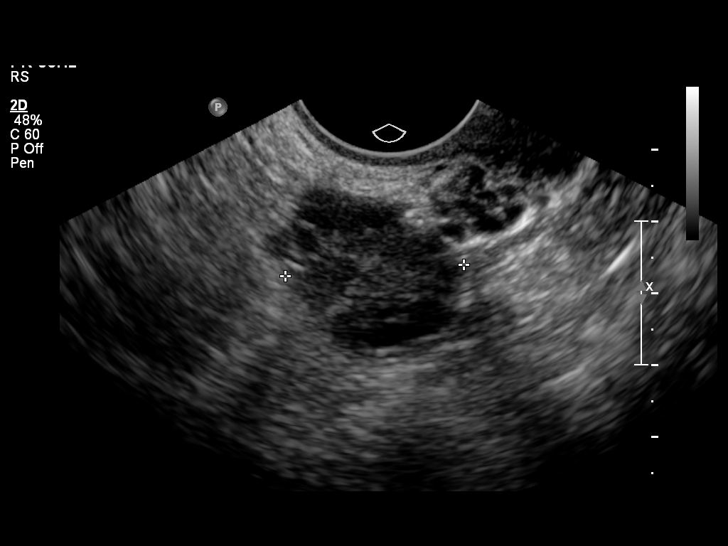
[im 35/42]
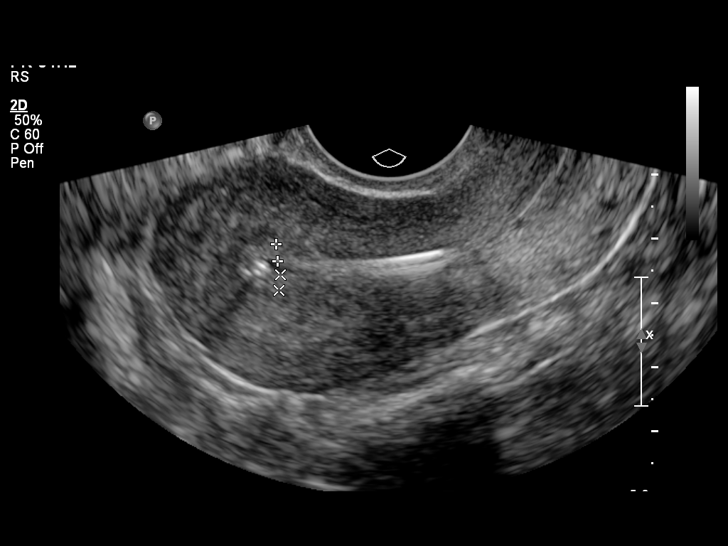
[im 38/42]
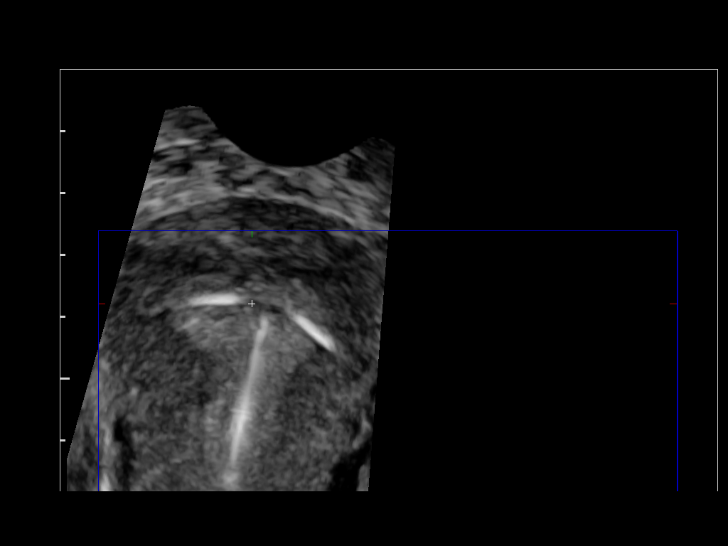
[im 42/42]
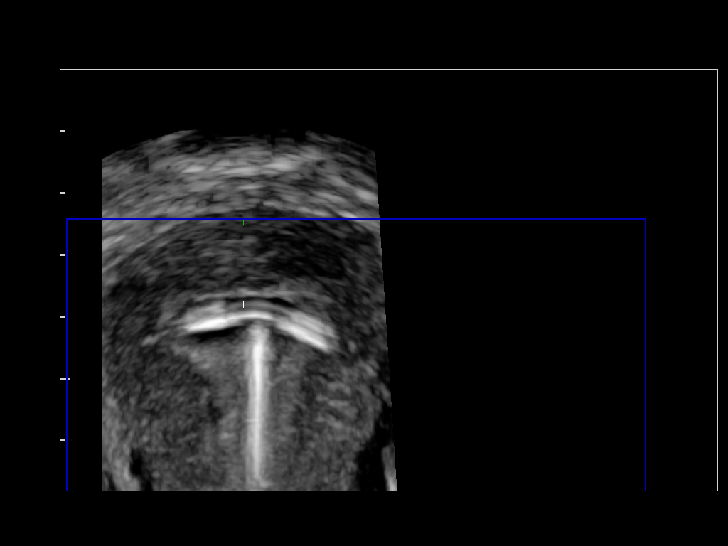

[13 of 25 positions shown; findings below may reference images not displayed]

FINDINGS: Uterus: Normal in size and echotexture without focal myometrial
abnormality, measuring approximately 7.9 x 3.8 x 4.9 cm.

Endometrium: Normal in thickness measuring approximately 5 mm.
Intrauterine device appropriately positioned within the fundal
endometrium.  Very small amount of simple appearing free fluid in
the endometrial canal.

Right ovary:  Normal in size measuring approximately 3.0 x 2.2 x
2.5 cm, containing small follicular cysts.  No dominant cyst or
solid masses.

Left ovary: Normal in size measuring approximately 3.6 x 1.7 x
cm, containing small follicular cysts.  No dominant cyst or solid
masses.

Other findings: No free fluid in the cul-de-sac.
IMPRESSION: 1.  No abnormalities identified to explain left-sided pelvic pain.
2.  Intrauterine device appropriately positioned within the fundal
endometrium.  Very small amount of simple appearing free fluid in
the endometrium is of doubtful clinical significance.
3.  Normal-appearing ovaries.  No adnexal masses or free pelvic
fluid.

## 2019-05-05 ENCOUNTER — Ambulatory Visit: Payer: Commercial Managed Care - PPO | Attending: Internal Medicine

## 2019-05-05 DIAGNOSIS — Z23 Encounter for immunization: Secondary | ICD-10-CM

## 2019-05-05 NOTE — Progress Notes (Signed)
   Covid-19 Vaccination Clinic  Name:  Katrina Oconnell    MRN: [id number] DOB: 05-28-1985  05/05/2019  Katrina Oconnell was observed post Covid-19 immunization for 15 minutes without incident. She was provided with Vaccine Information Sheet and instruction to access the V-Safe system.   Katrina Oconnell was instructed to call 911 with any severe reactions post vaccine: Marland Kitchen Difficulty breathing  . Swelling of face and throat  . A fast heartbeat  . A bad rash all over body  . Dizziness and weakness   Immunizations Administered    Name Date Dose VIS Date Route   Pfizer COVID-19 Vaccine 05/05/2019  4:30 PM 0.3 mL 02/04/2019 Intramuscular   Manufacturer: [employer], [employer]   Lot: [id number]   NDC: [address]-9

## 2019-05-31 ENCOUNTER — Ambulatory Visit: Payer: Commercial Managed Care - PPO | Attending: Internal Medicine

## 2019-05-31 DIAGNOSIS — Z23 Encounter for immunization: Secondary | ICD-10-CM

## 2019-05-31 NOTE — Progress Notes (Signed)
   Covid-19 Vaccination Clinic  Name:  Shabana Armentrout    MRN: [id number] DOB: 1985/06/07  05/31/2019  Ms. Mucha was observed post Covid-19 immunization for 15 minutes without incident. She was provided with Vaccine Information Sheet and instruction to access the V-Safe system.   Ms. Oshel was instructed to call 911 with any severe reactions post vaccine: Marland Kitchen Difficulty breathing  . Swelling of face and throat  . A fast heartbeat  . A bad rash all over body  . Dizziness and weakness   Immunizations Administered    Name Date Dose VIS Date Route   Pfizer COVID-19 Vaccine 05/31/2019  9:50 AM 0.3 mL 02/04/2019 Intramuscular   Manufacturer: [employer], [employer]   Lot: [id number]   NDC: [address]-3
